# Patient Record
Sex: Female | Born: 2000
Health system: Southern US, Community
[De-identification: ages and names within clinical notes are randomized; demographics above are authoritative.]

## PROBLEM LIST (undated history)

## (undated) HISTORY — PX: MAXILLARY CYST EXCISION: SHX2004

---
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
## 2018-01-04 DIAGNOSIS — M79642 Pain in left hand: Secondary | ICD-10-CM | POA: Diagnosis not present

## 2018-01-18 DIAGNOSIS — M79642 Pain in left hand: Secondary | ICD-10-CM | POA: Diagnosis not present

## 2018-04-25 DIAGNOSIS — Z0184 Encounter for antibody response examination: Secondary | ICD-10-CM | POA: Diagnosis not present

## 2018-04-26 ENCOUNTER — Ambulatory Visit: Payer: 59 | Admitting: Family Medicine

## 2018-04-26 VITALS — BP 104/64 | Temp 98.1°F | Wt 138.1 lb

## 2018-04-26 DIAGNOSIS — Z23 Encounter for immunization: Secondary | ICD-10-CM | POA: Diagnosis not present

## 2018-04-26 DIAGNOSIS — Z111 Encounter for screening for respiratory tuberculosis: Secondary | ICD-10-CM

## 2018-04-26 DIAGNOSIS — Z7189 Other specified counseling: Secondary | ICD-10-CM

## 2018-04-26 DIAGNOSIS — Z7185 Encounter for immunization safety counseling: Secondary | ICD-10-CM

## 2018-04-26 NOTE — Patient Instructions (Addendum)
Hi

## 2018-04-26 NOTE — Addendum Note (Signed)
Addended by: Kern ReapVEREEN, RACHEL B on: 04/26/2018 01:58 PM   Modules accepted: Orders

## 2018-04-26 NOTE — Progress Notes (Signed)
  Subjective:     Patient ID: Katherine Dorsey, female   DOB: 2000-11-15, 17 y.o.   MRN: [id number]  HPI Patient here for immunization counseling.  She will be [facility] [employer] in the fall. She's got her previous immunizations through pediatric office. She apparently only had one meningitis vaccine which was 03/02/14. She had Tdap 08/04/13. Other immunizations appear to be up-to-date.  She is also requesting Quantiferon TB Gold assay.  No recent international travel. Very low risk for TB  No past medical history on file. Past Surgical History:  Procedure Laterality Date  . EYE SURGERY Right 1998-11-15   No opening in tear duct  . MAXILLARY CYST EXCISION      reports that she has never smoked. She does not have any smokeless tobacco history on file. She reports that she does not drink alcohol or use drugs. family history is not on file. No Known Allergies   Review of Systems  Constitutional: Negative for appetite change, chills, fever and unexpected weight change.  Respiratory: Negative for shortness of breath.   Skin: Negative for rash.  Hematological: Negative for adenopathy.       Objective:   Physical Exam  Constitutional: She appears well-developed and well-nourished.  Cardiovascular: Normal rate and regular rhythm.  No murmur heard. Pulmonary/Chest: Effort normal and breath sounds normal.  Skin: No rash noted.       Assessment:     Immunization counseling.  Patient preparing to start [employer]. She needs meningitis booster and also apparently school requiring tuberculosis screen    Plan:     -Meningitis booster given. We discussed meningitis B vaccine and at this point they wish to wait on that -Quantiferon TB Gold assay sent  Kristian CoveyBruce W Burchette MD [facility]

## 2018-04-27 DIAGNOSIS — Z111 Encounter for screening for respiratory tuberculosis: Secondary | ICD-10-CM | POA: Diagnosis not present

## 2018-04-30 LAB — QUANTIFERON-TB GOLD PLUS
Mitogen-NIL: >10 IU/mL
NIL: 0.06 [IU]/mL
QuantiFERON-TB Gold Plus: NEGATIVE
TB1-NIL: <0 IU/mL

## 2019-08-28 IMAGING — DX DG HAND COMPLETE 3+V*L*
3 series · 3 of 3 positions shown · non-contrast
Comparison: None.

CLINICAL DATA: Index finger injury playing volleyball.

EXAM:
LEFT HAND - COMPLETE 3+ VIEW

[hand pa]
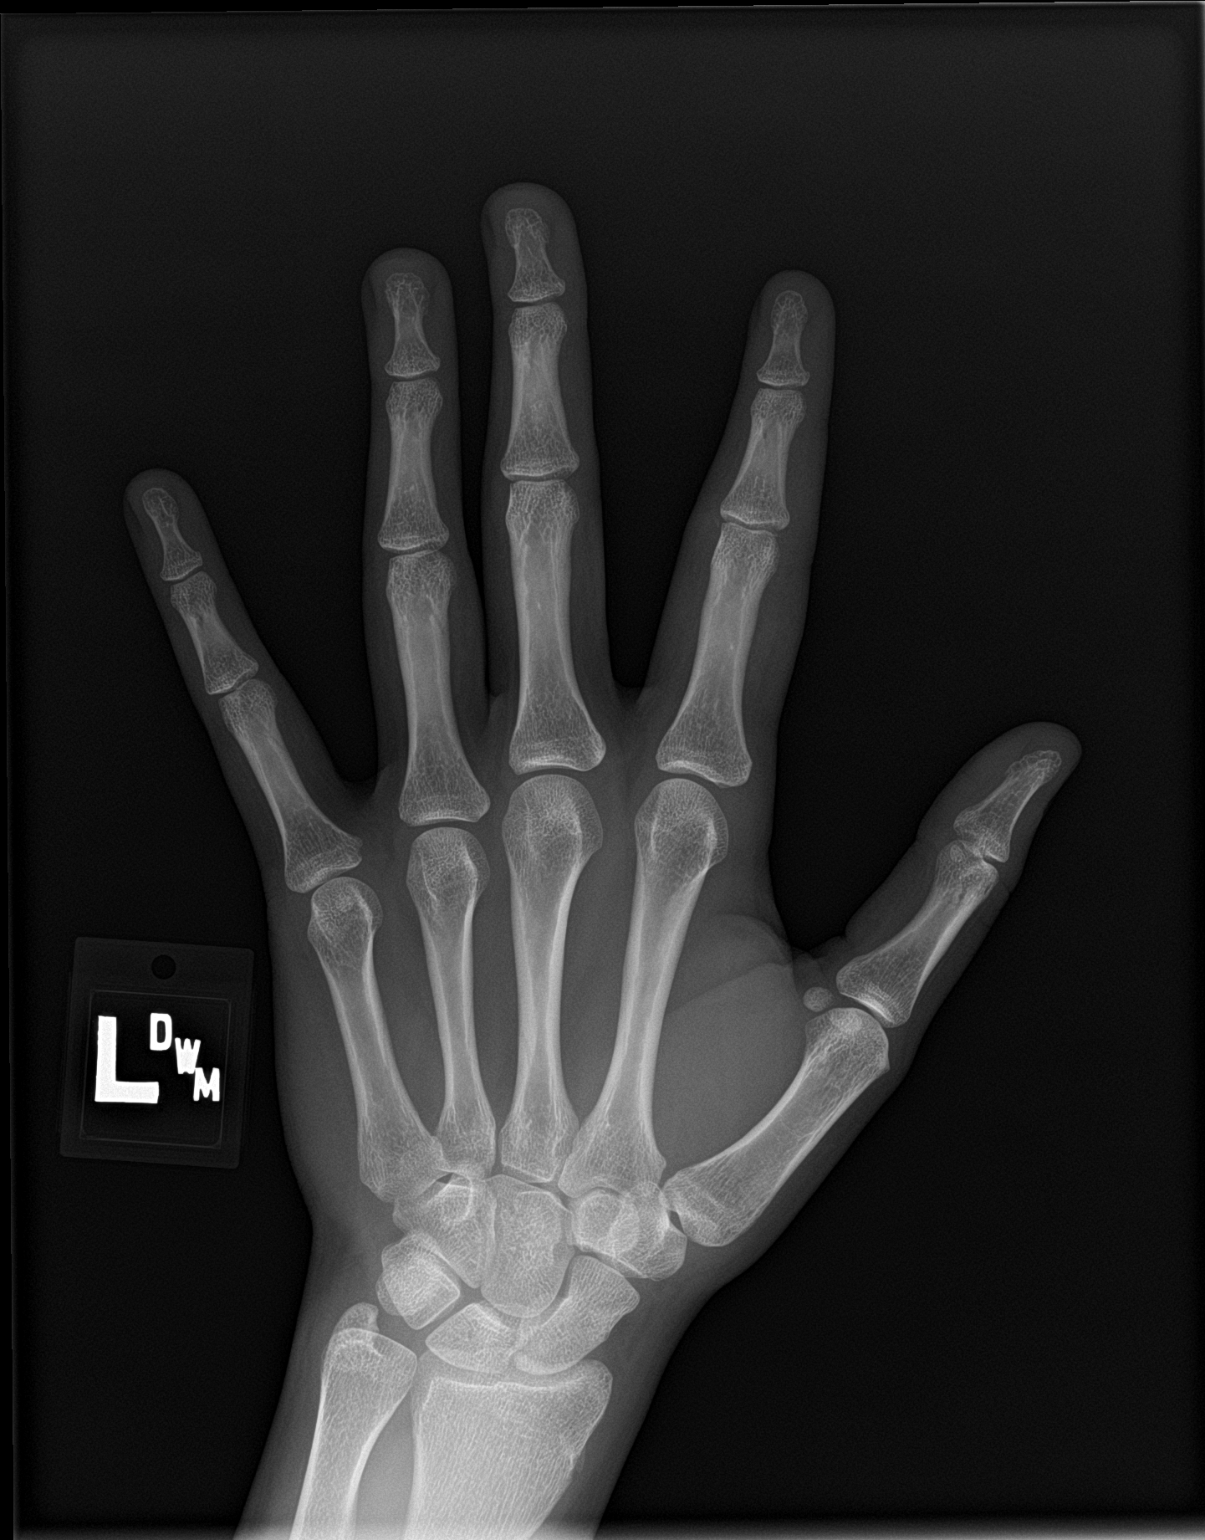

[hand obl]
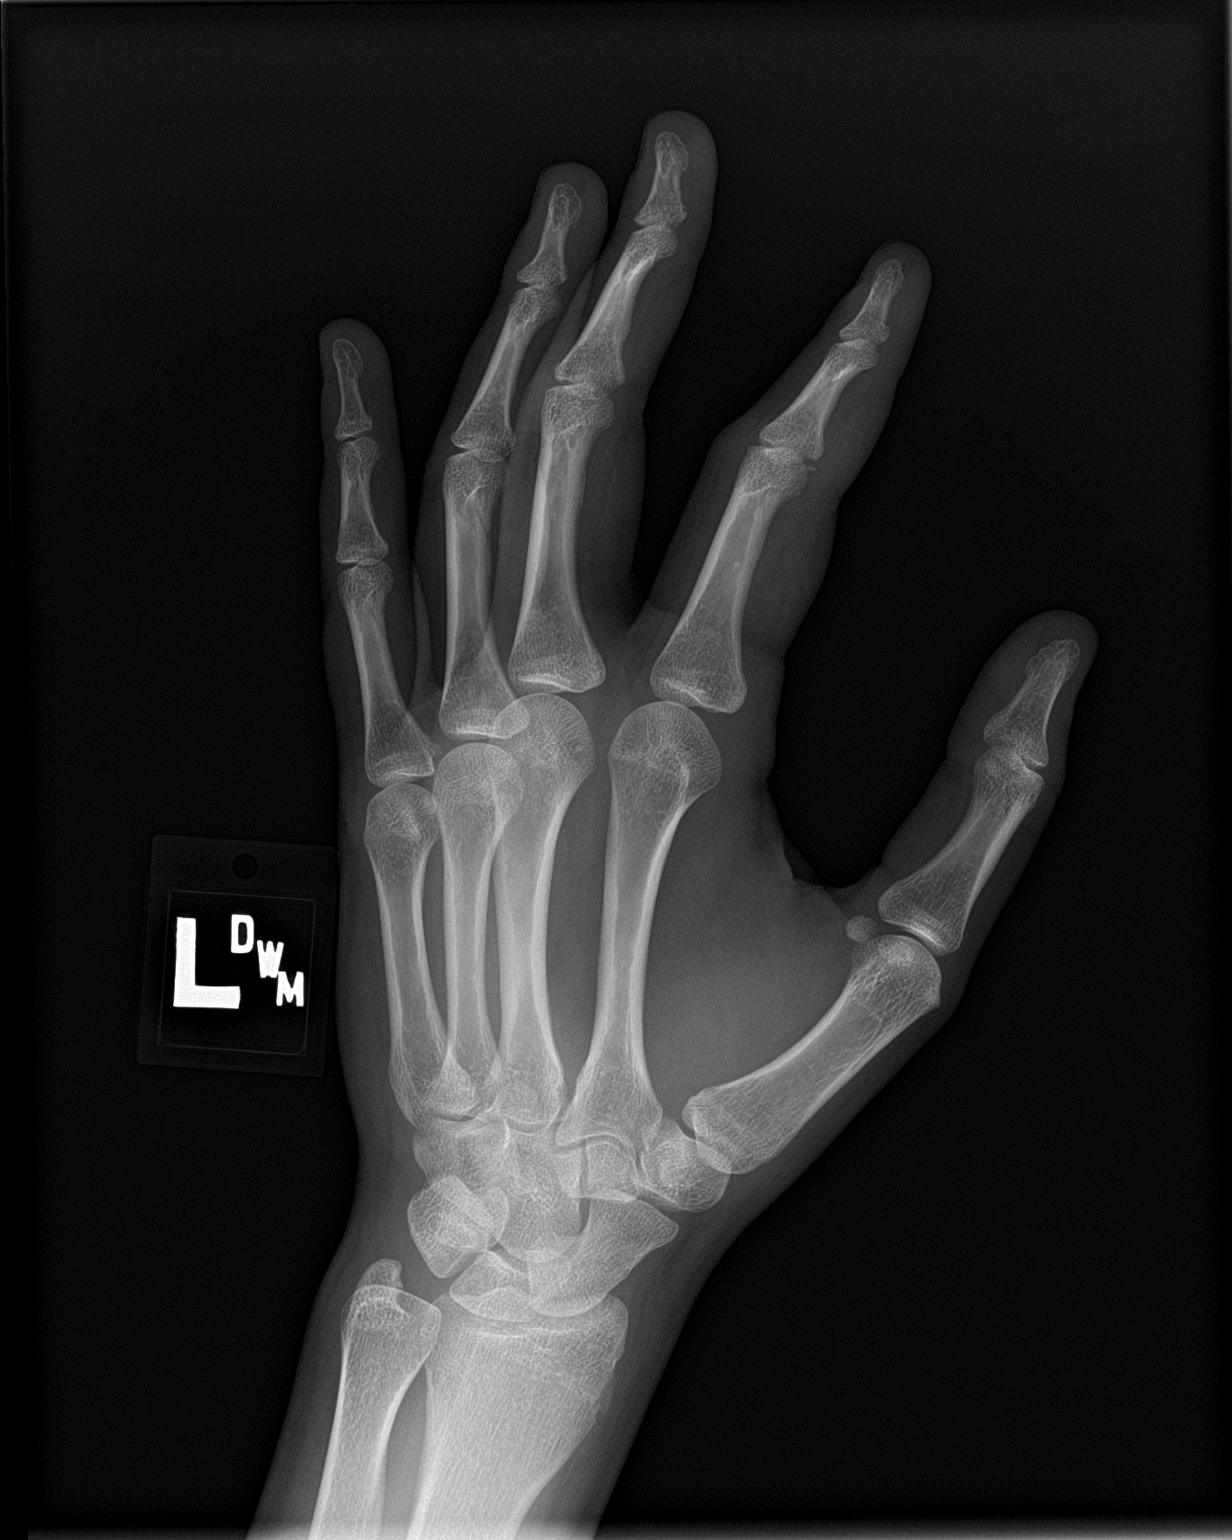

[hand lat]
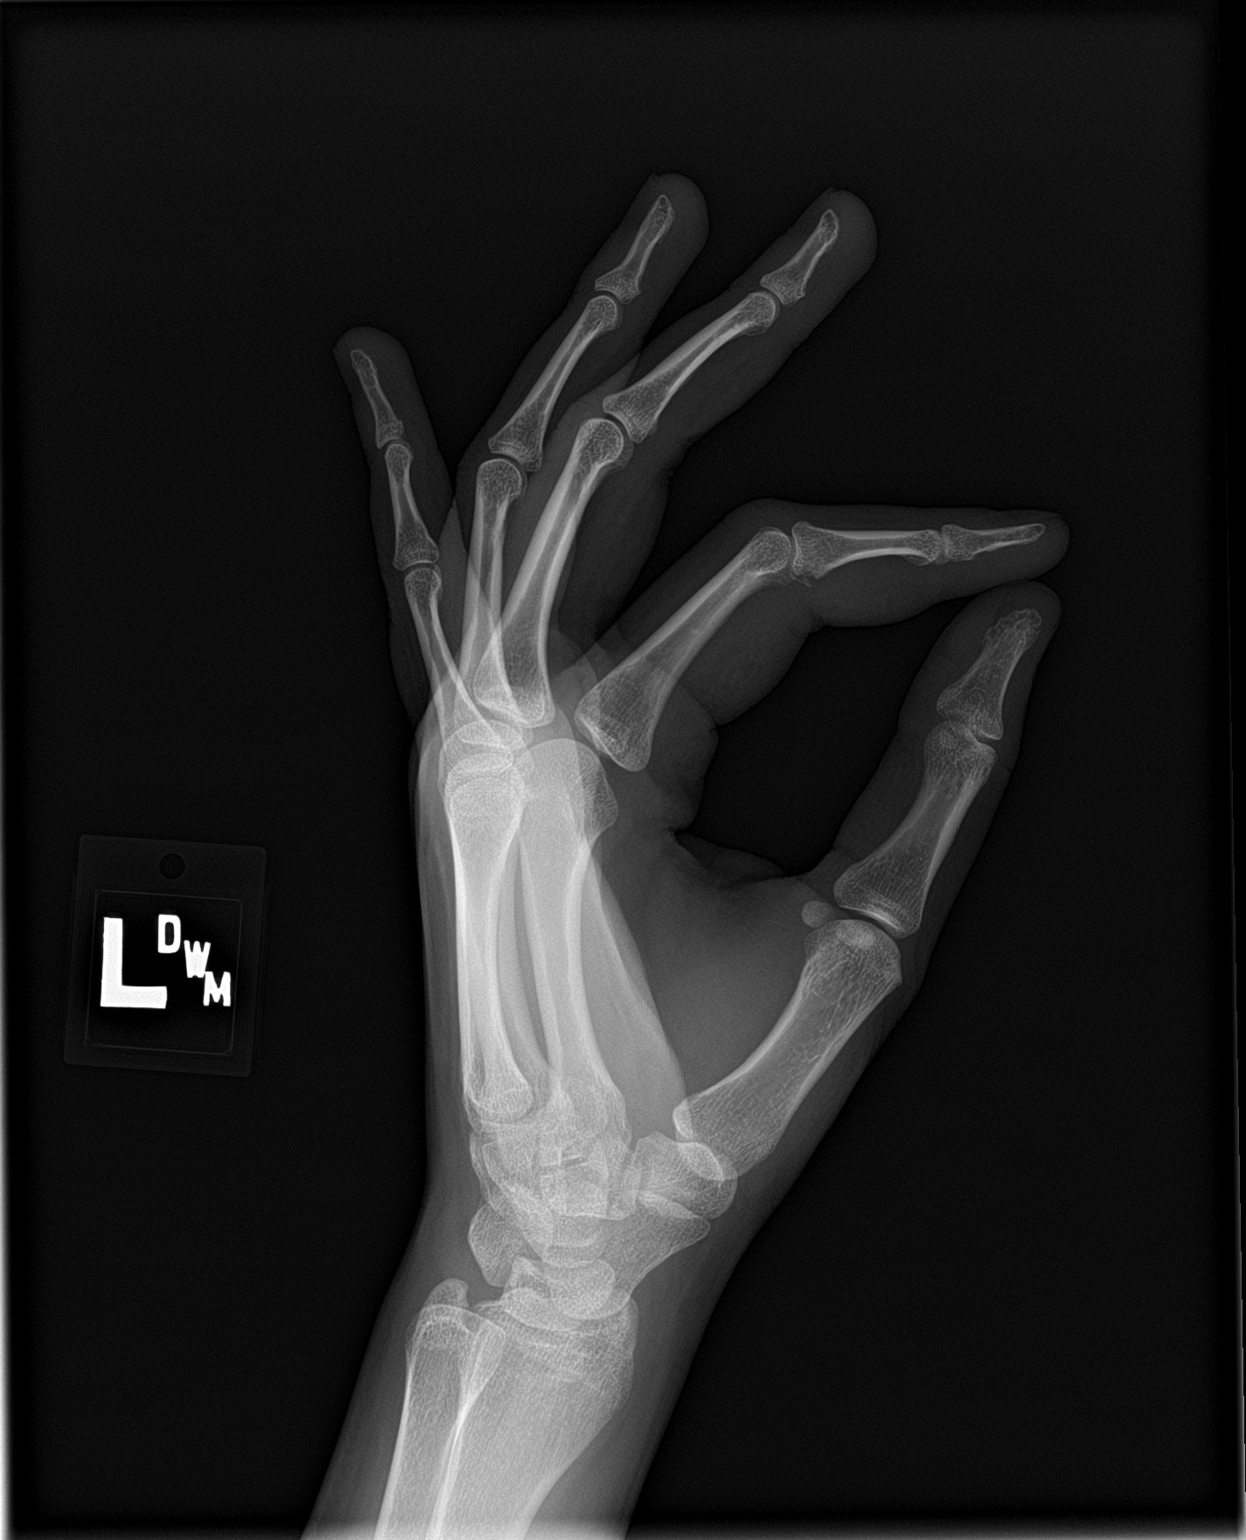

[3 of 3 positions shown; findings below may reference images not displayed]

FINDINGS: Volar plate cortical avulsion injury identified at the base of the
index finger middle phalanx. No other acute fracture evident. No
subluxation or dislocation. No worrisome lytic or sclerotic osseous
abnormality.
IMPRESSION: Acute volar plate cortical avulsion fracture involving the base of
the index finger middle phalanx.
